# Patient Record
Sex: Female | Born: 1990 | Race: Black or African American | Hispanic: No | Marital: Single | Smoking: Never smoker
Health system: Southern US, Community
[De-identification: ages and names within clinical notes are randomized; demographics above are authoritative.]

## PROBLEM LIST (undated history)

## (undated) DIAGNOSIS — G919 Hydrocephalus, unspecified: Secondary | ICD-10-CM

## (undated) DIAGNOSIS — R519 Headache, unspecified: Secondary | ICD-10-CM

## (undated) DIAGNOSIS — R0600 Dyspnea, unspecified: Secondary | ICD-10-CM

## (undated) HISTORY — PX: VENTRICULOPERITONEAL SHUNT: SHX204

## (undated) HISTORY — DX: Hydrocephalus, unspecified: G91.9

## (undated) HISTORY — PX: LAPAROSCOPIC REVISION VENTRICULAR-PERITONEAL (V-P) SHUNT: SHX5924

---

## 2019-07-27 ENCOUNTER — Other Ambulatory Visit: Payer: Self-pay

## 2019-07-27 ENCOUNTER — Ambulatory Visit (LOCAL_COMMUNITY_HEALTH_CENTER): Payer: Self-pay

## 2019-07-27 DIAGNOSIS — Z113 Encounter for screening for infections with a predominantly sexual mode of transmission: Secondary | ICD-10-CM

## 2019-07-27 LAB — HM HIV SCREENING LAB: HM HIV Screening: NEGATIVE

## 2019-07-27 NOTE — Progress Notes (Signed)
Here for STD screen. Requests PT even though she started 07/25/19.Aileen Fass, RN   RN will call with any positive results.Aileen Fass, RN

## 2019-07-27 NOTE — Progress Notes (Signed)
    STI clinic/screening visit  Subjective:  Noga Fogg is a 28 y.o. female being seen today for an STI screening visit. The patient reports they do not have symptoms.  Patient has the following medical conditions:  Client was treated for syphillis in 2020 @[facility].  There are no active problems to display for this patient.    Chief Complaint  Patient presents with  . SEXUALLY TRANSMITTED DISEASE    Screening    HPI  Patient reports no sympts.  Wants STD screen today.    See flowsheet for further details and programmatic requirements.    The following portions of the patient's history were reviewed and updated as appropriate: allergies, current medications, past medical history, past social history, past surgical history and problem list.  Objective:  There were no vitals filed for this visit.  Physical Exam HENT:     Mouth/Throat:     Pharynx: Oropharynx is clear.  Genitourinary:    General: Normal vulva.     Comments: Mod amt of blood noted   Wet prep not done d/t bleeding Skin:    General: Skin is warm and dry.     Findings: Lesion present. No rash.  Neurological:     Mental Status: She is alert.       Assessment and Plan:  Eli Adami is a 28 y.o. female presenting to the [facility] Department for STI screening  1. Screening examination for venereal disease - HIV [facility] - Syphilis Serology, [facility] - Chlamydia/Gonorrhea [facility] Client will be notified if testing is positive.   No follow-ups on file.  No future appointments.  Hassell Done, FNP

## 2019-08-02 LAB — GONOCOCCUS CULTURE

## 2019-08-08 ENCOUNTER — Encounter: Payer: Self-pay | Admitting: Physician Assistant

## 2019-08-08 DIAGNOSIS — A515 Early syphilis, latent: Secondary | ICD-10-CM | POA: Insufficient documentation

## 2019-09-14 ENCOUNTER — Other Ambulatory Visit: Payer: Self-pay

## 2019-09-14 ENCOUNTER — Encounter: Payer: Self-pay | Admitting: Emergency Medicine

## 2019-09-14 ENCOUNTER — Emergency Department
Admission: EM | Admit: 2019-09-14 | Discharge: 2019-09-14 | Disposition: A | Discharge status: Home / Self Care | Payer: Medicaid Other | Attending: Emergency Medicine | Admitting: Emergency Medicine

## 2019-09-14 ENCOUNTER — Emergency Department: Payer: Medicaid Other

## 2019-09-14 DIAGNOSIS — R1030 Lower abdominal pain, unspecified: Secondary | ICD-10-CM | POA: Diagnosis not present

## 2019-09-14 DIAGNOSIS — O26891 Other specified pregnancy related conditions, first trimester: Secondary | ICD-10-CM | POA: Diagnosis not present

## 2019-09-14 DIAGNOSIS — Z3A01 Less than 8 weeks gestation of pregnancy: Secondary | ICD-10-CM | POA: Insufficient documentation

## 2019-09-14 DIAGNOSIS — R109 Unspecified abdominal pain: Secondary | ICD-10-CM | POA: Diagnosis present

## 2019-09-14 LAB — URINALYSIS, COMPLETE (UACMP) WITH MICROSCOPIC
Bacteria, UA: NONE SEEN
Bilirubin Urine: NEGATIVE
Glucose, UA: NEGATIVE mg/dL
Hgb urine dipstick: NEGATIVE
Ketones, ur: NEGATIVE mg/dL
Leukocytes,Ua: NEGATIVE
Nitrite: NEGATIVE
Protein, ur: NEGATIVE mg/dL
Specific Gravity, Urine: 1.005 (ref 1.005–1.030)
pH: 6 (ref 5.0–8.0)

## 2019-09-14 LAB — COMPREHENSIVE METABOLIC PANEL
ALT: 26 U/L (ref 0–44)
AST: 18 U/L (ref 15–41)
Albumin: 4.3 g/dL (ref 3.5–5.0)
Alkaline Phosphatase: 55 U/L (ref 38–126)
Anion gap: 8 (ref 5–15)
BUN: 8 mg/dL (ref 6–20)
CO2: 24 mmol/L (ref 22–32)
Calcium: 9.3 mg/dL (ref 8.9–10.3)
Chloride: 104 mmol/L (ref 98–111)
Creatinine, Ser: 0.79 mg/dL (ref 0.44–1.00)
GFR calc Af Amer: >60 mL/min (ref >60)
GFR calc non Af Amer: >60 mL/min (ref >60)
Glucose, Bld: 99 mg/dL (ref 70–99)
Potassium: 3.9 mmol/L (ref 3.5–5.1)
Sodium: 136 mmol/L (ref 135–145)
Total Bilirubin: 0.6 mg/dL (ref 0.3–1.2)
Total Protein: 7.6 g/dL (ref 6.5–8.1)

## 2019-09-14 LAB — WET PREP, GENITAL
Sperm: NONE SEEN
Trich, Wet Prep: NONE SEEN
Yeast Wet Prep HPF POC: NONE SEEN

## 2019-09-14 LAB — HCG, QUANTITATIVE, PREGNANCY: hCG, Beta Chain, Quant, S: 60830 m[IU]/mL — ABNORMAL HIGH (ref <5)

## 2019-09-14 LAB — LIPASE, BLOOD: Lipase: 41 U/L (ref 11–51)

## 2019-09-14 LAB — CBC
HCT: 38.2 % (ref 36.0–46.0)
Hemoglobin: 12.4 g/dL (ref 12.0–15.0)
MCH: 26.2 pg (ref 26.0–34.0)
MCHC: 32.5 g/dL (ref 30.0–36.0)
MCV: 80.8 fL (ref 80.0–100.0)
Platelets: 235 10*3/uL (ref 150–400)
RBC: 4.73 MIL/uL (ref 3.87–5.11)
RDW: 13.2 % (ref 11.5–15.5)
WBC: 8.3 10*3/uL (ref 4.0–10.5)
nRBC: 0 % (ref 0.0–0.2)

## 2019-09-14 LAB — POCT PREGNANCY, URINE: Preg Test, Ur: POSITIVE — AB

## 2019-09-14 MED ORDER — SODIUM CHLORIDE 0.9% FLUSH: 3.0000 mL | Freq: Once | INTRAVENOUS | Status: DC

## 2019-09-14 NOTE — ED Provider Notes (Signed)
[facility] Emergency Department Provider Note   ____________________________________________   First MD Initiated Contact with Patient 09/14/19 1811     (approximate)  I have reviewed the triage vital signs and the nursing notes.   HISTORY  Chief Complaint Abdominal Pain    HPI Mary Brewer is a 28 y.o. female with past medical history of hydrocephalus status post VP shunt presents to the ED complaining of abdominal pain.  Patient reports crampy bilateral lower quadrant abdominal pain for approximately the past 2 weeks.  Pain is constant and not exacerbated or alleviated by anything.  Patient states she has been trying to get pregnant for a long time and recently had 3+ pregnancy test at home.  She denies any vaginal bleeding, vaginal discharge, dysuria, or hematuria.  She states her last menstrual period was approximately 2 months ago.  She has not yet seen an OB and this is her first pregnancy.        Past Medical History:  Diagnosis Date  . Hydrocephalus (HCC)    birth and VP shunt [facility]    Patient Active Problem List   Diagnosis Date Noted  . 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 No pertinent surgical history.  Prior to Admission medications   Not on File    Allergies Patient has no known allergies.  No family history on file.  Social History Social History   Tobacco Use  . Smoking status: Never Smoker  . Smokeless tobacco: Never Used  Substance Use Topics  . Alcohol use: Not on file  . Drug use: Not on file    Review of Systems  Constitutional: No fever/chills Eyes: No visual changes. ENT: No sore throat. Cardiovascular: Denies chest pain. Respiratory: Denies shortness of breath. Gastrointestinal: Positive for abdominal pain.  No nausea, no vomiting.  No diarrhea.  No constipation. Genitourinary: Negative for dysuria. Musculoskeletal: Negative for back pain. Skin: Negative for rash.  Neurological: Negative for headaches, focal weakness or numbness.  ____________________________________________   PHYSICAL EXAM:  VITAL SIGNS: ED Triage Vitals  Enc Vitals Group     BP 09/14/19 1651 (!) 141/94     Pulse Rate 09/14/19 1651 78     Resp 09/14/19 1651 16     Temp 09/14/19 1651 98.5 F (36.9 C)     Temp Source 09/14/19 1651 Oral     SpO2 09/14/19 1651 100 %     Weight 09/14/19 1652 175 lb (79.4 kg)     Height 09/14/19 1652 5\' 5"  (1.651 m)     Head Circumference --      Peak Flow --      Pain Score 09/14/19 1651 5     Pain Loc --      Pain Edu? --      Excl. in [place]? --     Constitutional: Alert and oriented. Eyes: Conjunctivae are normal. Head: Atraumatic. Nose: No congestion/rhinnorhea. Mouth/Throat: Mucous membranes are moist. Neck: Normal ROM Cardiovascular: Normal rate, regular rhythm. Grossly normal heart sounds. Respiratory: Normal respiratory effort.  No retractions. Lungs CTAB. Gastrointestinal: Soft and nontender. No distention. Genitourinary: Cervical os closed, no cervical motion or next tenderness.  No bleeding or discharge noted. Musculoskeletal: No lower extremity tenderness nor edema. Neurologic:  Normal speech and language. No gross focal neurologic deficits are appreciated. Skin:  Skin is warm, dry and intact. No rash noted. Psychiatric: Mood and affect are normal. Speech and behavior are normal.  ____________________________________________   LABS (all labs ordered  are listed, but only abnormal results are displayed)  Labs Reviewed  WET PREP, GENITAL - Abnormal; Notable for the following components:      Result Value   Clue Cells Wet Prep HPF POC PRESENT (*)    WBC, Wet Prep HPF POC RARE (*)    All other components within normal limits  URINALYSIS, COMPLETE (UACMP) WITH MICROSCOPIC - Abnormal; Notable for the following components:   Color, Urine STRAW (*)    APPearance CLEAR (*)    All other components within normal limits  HCG,  QUANTITATIVE, PREGNANCY - Abnormal; Notable for the following components:   hCG, Beta Chain, Quant, S 60,830 (*)    All other components within normal limits  POCT PREGNANCY, URINE - Abnormal; Notable for the following components:   Preg Test, Ur POSITIVE (*)    All other components within normal limits  GC/CHLAMYDIA PROBE AMP  LIPASE, BLOOD  COMPREHENSIVE METABOLIC PANEL  CBC  POC URINE PREG, ED     PROCEDURES  Procedure(s) performed (including Critical Care):  Procedures   ____________________________________________   INITIAL IMPRESSION / ASSESSMENT AND PLAN / ED COURSE       28 year old female presents to the ED for bilateral lower quadrant crampy abdominal pain for approximately the past 2 weeks, recently had positive pregnancy test at home.  Abdominal exam is soft and nontender, doubt surgical abdominal pathology.  Pelvic exam is unremarkable.  UA without evidence of infection.  No bleeding to necessitate RhoGam.  Will obtain ultrasound to ensure pregnancy is intrauterine, labs otherwise unremarkable.  Ultrasound shows intrauterine pregnancy with appropriate fetal heart tones.  Sizing of fetus does appear to be small given dates provided by patient.  Unclear if this is due to inaccurate dating or poor fetal growth, will have patient follow-up with OB/GYN.  Counseled patient to return to the ED for new or worsening symptoms, patient agrees with plan.      ____________________________________________   FINAL CLINICAL IMPRESSION(S) / ED DIAGNOSES  Final diagnoses:  Abdominal pain during pregnancy in first trimester     ED Discharge Orders    None       Note:  This document was prepared using Dragon voice recognition software and may include unintentional dictation errors.   Blake Divine, MD 09/14/19 2110

## 2019-09-14 NOTE — ED Triage Notes (Signed)
Has taken 3 home pregnancy tests, all positive.  Arrives today c/o abdominal and back x 2 weeks.  LMP:  12/29/2018.

## 2019-09-14 NOTE — ED Notes (Signed)
Pt up to give a dirty urine sample for gonorrhea and chlamydia. Explained patient will go to [place]. Pt's SO remains at bedside.

## 2019-09-14 NOTE — ED Notes (Signed)
Pt c/o abdominal pain x several weeks. Pt states has had 3 positive home pregnancy tests with lower abdominal pain. Pt is alert and oriented, NAD noted, respirations even and unlabored, skin warm, dry, and intact.

## 2019-09-14 NOTE — ED Notes (Signed)
Patient transported to Ultrasound 

## 2019-09-19 ENCOUNTER — Other Ambulatory Visit: Payer: Self-pay

## 2019-09-19 LAB — GC/CHLAMYDIA PROBE AMP
Chlamydia trachomatis, NAA: NEGATIVE
Neisseria Gonorrhoeae by PCR: NEGATIVE

## 2019-11-07 NOTE — H&P (Signed)
Mary Brewer is a 28 y.o. female here for exlap and right ovarian cystectomy  .  U/s performed in ED at [facility]  09/14/2019. Pt has had pelvic pain daily for 3 weeks . + preg at 14+3 weeks   Syphilis  06/2019. New partner  U/s today   Uterus anteverted  Single, viable IUP, S=[redacted]w[redacted]d  FHR=156bpm  Cervical length=4.04cm  Small amt of free fluid seen in PCDS  Rt complex adnexal mass=10.59 x 7.67 x 10.61cm; fluid pocket seen superior to mass=7.45 x 1.87 x 2.90cm  Lt complex septated ovarian cyst=2.7cm; septation=0.21cm    Past Medical History:  has a past medical history of Encounter for supervision of normal pregnancy in second trimester (11/03/2019) and Hydrocephalus (CMS-HCC).  Past Surgical History:  has a past surgical history that includes Externalization Vp Shunt. Family History: family history includes Diabetes in Mary Brewer; Neuropathy in Mary Brewer. Social History:  reports that she has never smoked. She has never used smokeless tobacco. She reports current alcohol use. She reports current drug use. Drug: Marijuana. OB/GYN History:          OB History    Gravida  1   Para      Term      Preterm      AB      Living        SAB      TAB      Ectopic      Molar      Multiple      Live Births             Allergies: has No Known Allergies. Medications:  Current Outpatient Medications:  .  prenatal vitamin with iron-folic acid (PRENATAL TABLETS) tablet, Take 1 tablet by mouth once daily, Disp: , Rfl:  .  valACYclovir (VALTREX) 1000 MG tablet, Take 1 tablet (1,000 mg total) by mouth 2 (two) times daily for 10 days SuppressiveTherapy, Disp: 20 tablet, Rfl: 0  Review of Systems: General:                      No fatigue or weight loss Eyes:                           No vision changes Ears:                            No hearing difficulty Respiratory:                No cough or shortness of breath Pulmonary:                  No asthma or  shortness of breath Cardiovascular:           No chest pain, palpitations, dyspnea on exertion Gastrointestinal:          No abdominal bloating, chronic diarrhea, constipations, masses, pain or hematochezia Genitourinary:             No hematuria, dysuria, abnormal vaginal discharge, +pelvic pain, Menometrorrhagia Lymphatic:                   No swollen lymph nodes Musculoskeletal:         No muscle weakness Neurologic:                  No extremity weakness, syncope, seizure disorder Psychiatric:  No history of depression, delusions or suicidal/homicidal ideation    Exam:      Vitals:   11/03/19 1504  BP: 130/87  Pulse: 84    Body mass index is 36.78 kg/m.  WDWN / black female in NAD   Lungs: CTA  CV : RRR without murmur   Breast: exam done in sitting and lying position : No dimpling or retraction, no dominant mass, no spontaneous discharge, no axillary adenopathy Neck:  no thyromegaly Abdomen: soft , no mass, normal active bowel sounds,  non-tender, no rebound tenderness  + FHM noted with bedside u/s  Pelvic: tanner stage 5 ,  External genitalia: vulva /labiashallow ulceration left perirectal . ++TTP Urethra: no prolapse Vagina: normal physiologic d/c Cervix: no lesions, no cervical motion tenderness   Uterus: normal size shape and contour, non-tender Adnexa: right adnexal fullness  Rectovaginal:   Impression:   The primary encounter diagnosis was Vulvar lesion. Diagnoses of Complex cyst of right ovary and Intrauterine pregnancy were also pertinent to this visit.  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  I recommend a exlap and right ovarian cystectomy and possible right oophorectomy .   Benefits  and risks to surgery: The proposed benefit of the surgery has been discussed with the patient. The possible risks include, but are not limited to: organ injury to the bowel , bladder, ureters, and major blood vessels and nerves. There is a possibility of additional surgeries resulting from these injuries. There is also the risk of blood transfusion and the need to receive blood products during or after the procedure which may rarely lead to HIV or Hepatitis C infection. There is a risk of developing a deep venous thrombosis or a pulmonary embolism . There is the possibility of wound infection and also anesthetic complications, even the rare possibility of death. The patient understands these risks and wishes to proceed. All questions have been answered  Rarely risk of causing fetal loss.   Pt will return for preop signing of consents 60 minutes spent with counseling and direct pt care

## 2019-11-08 ENCOUNTER — Other Ambulatory Visit
Admission: RE | Admit: 2019-11-08 | Discharge: 2019-11-08 | Disposition: A | Discharge status: Home / Self Care | Payer: Medicaid Other | Source: Ambulatory Visit | Attending: Obstetrics and Gynecology | Admitting: Obstetrics and Gynecology

## 2019-11-08 DIAGNOSIS — Z20828 Contact with and (suspected) exposure to other viral communicable diseases: Secondary | ICD-10-CM | POA: Diagnosis not present

## 2019-11-08 DIAGNOSIS — Z01812 Encounter for preprocedural laboratory examination: Secondary | ICD-10-CM | POA: Insufficient documentation

## 2019-11-08 HISTORY — DX: Headache, unspecified: R51.9

## 2019-11-08 HISTORY — DX: Dyspnea, unspecified: R06.00

## 2019-11-09 LAB — SARS CORONAVIRUS 2 (TAT 6-24 HRS): SARS Coronavirus 2: NEGATIVE

## 2019-11-10 ENCOUNTER — Encounter
Admission: RE | Admit: 2019-11-10 | Discharge: 2019-11-10 | Disposition: A | Discharge status: Home / Self Care | Payer: Medicaid Other | Source: Ambulatory Visit | Attending: Obstetrics and Gynecology | Admitting: Obstetrics and Gynecology

## 2019-11-10 NOTE — Progress Notes (Signed)
preadmit had concerns about what the patient told them yesterday . She was feeling tachycardic and feeling like she was going to pass out .  She has had a VP shunt for years and this has not been an issue with her health . Is speaking with her today she denies symptoms and states that these symptoms happen rarely . She is [redacted] weeks pregnant . She is eating and drinking normally ibuprofen spoke Dr Amie Critchley and he is ok with proceeding with her case given it is a time sensitive surgery and her symptoms are infrequent. I believe the benefit of surgery out weigh the risk of her symptoms for this 1 hr  GETA case .

## 2019-11-10 NOTE — Pre-Procedure Instructions (Signed)
During phone interview pt c/o tachycardia and feeling like she was going to pass out. Pt with hydrocephalus who has a VP shunt. Pt is [redacted] weeks pregnant. Informed Dr Rosey Bath of this and he wants medical clearance due to feeling like she is going to pass out. Pt does not have a PCP and is only seen at the health department

## 2019-11-10 NOTE — Patient Instructions (Signed)
Your procedure is scheduled on: 11-11-19 FRIDAY Report to Same Day Surgery 2nd floor medical mall [facility] Entrance-take elevator on left to 2nd floor.  Check in with surgery information desk.) To find out your arrival time please call [phone] between 1PM - 3PM on 11-10-19 THURSDAY  Remember: Instructions that are not followed completely may result in serious medical risk, up to and including death, or upon the discretion of your surgeon and anesthesiologist your surgery may need to be rescheduled.    _x___ 1. Do not eat food after midnight the night before your procedure. NO GUM OR CANDY AFTER MIDNIGHT. You may drink clear liquids up to 2 hours before you are scheduled to arrive at the hospital for your procedure.  Do not drink clear liquids within 2 hours of your scheduled arrival to the hospital.  Clear liquids include  --Water or Apple juice without pulp  --Clear carbohydrate beverage such as ClearFast or Gatorade  --Black Coffee or Clear Tea (No milk, no creamers, do not add anything to the coffee or Tea   ____Ensure clear carbohydrate drink on the way to the hospital for bariatric patients  ____Ensure clear carbohydrate drink 3 hours before surgery.    __x__ 2. No Alcohol for 24 hours before or after surgery.   __x__3. No Smoking or e-cigarettes for 24 prior to surgery.  Do not use any chewable tobacco products for at least 6 hour prior to surgery   ____  4. Bring all medications with you on the day of surgery if instructed.    __x__ 5. Notify your doctor if there is any change in your medical condition     (cold, fever, infections).    x___6. On the morning of surgery brush your teeth with toothpaste and water.  You may rinse your mouth with mouth wash if you wish.  Do not swallow any toothpaste or mouthwash.   Do not wear jewelry, make-up, hairpins, clips or nail polish.  Do not wear lotions, powders, or perfumes. You may wear deodorant.  Do not shave 48 hours  prior to surgery. Men may shave face and neck.  Do not bring valuables to the hospital.    [facility] is not responsible for any belongings or valuables.               Contacts, dentures or bridgework may not be worn into surgery.  Leave your suitcase in the car. After surgery it may be brought to your room.  For patients admitted to the hospital, discharge time is determined by your treatment team.  _  Patients discharged the day of surgery will not be allowed to drive home.  You will need someone to drive you home and stay with you the night of your procedure.    Please read over the following fact sheets that you were given:   [facility] Preparing for Surgery   ____ Take anti-hypertensive listed below, cardiac, seizure, asthma,anti-reflux and psychiatric medicines. These include:  1. NONE  2.  3.  4.  5.  6.  ____Fleets enema or Magnesium Citrate as directed.   ____ Use CHG Soap or sage wipes as directed on instruction sheet   ____ Use inhalers on the day of surgery and bring to hospital day of surgery  ____ Stop Metformin and Janumet 2 days prior to surgery.    ____ Take 1/2 of usual insulin dose the night before surgery and none on the morning surgery.   ____ Follow recommendations from  Cardiologist, Pulmonologist or PCP regarding stopping Aspirin, Coumadin, Plavix ,Eliquis, Effient, or Pradaxa, and Pletal.  X____Stop Anti-inflammatories such as Advil, Aleve, Ibuprofen, Motrin, Naproxen, Naprosyn, Goodies powders or aspirin products NOW-OK to take Tylenol   ____ Stop supplements until after surgery.     ____ Bring C-Pap to the hospital.

## 2019-11-11 ENCOUNTER — Observation Stay: Payer: Medicaid Other | Admitting: Anesthesiology

## 2019-11-11 ENCOUNTER — Encounter: Payer: Self-pay | Admitting: *Deleted

## 2019-11-11 ENCOUNTER — Other Ambulatory Visit: Payer: Self-pay

## 2019-11-11 ENCOUNTER — Observation Stay
Admission: RE | Admit: 2019-11-11 | Discharge: 2019-11-11 | Disposition: A | Discharge status: Home / Self Care | Payer: Medicaid Other | Attending: Obstetrics and Gynecology | Admitting: Obstetrics and Gynecology

## 2019-11-11 ENCOUNTER — Encounter
Admission: RE | Disposition: A | Discharge status: Home / Self Care | Payer: Self-pay | Source: Home / Self Care | Attending: Obstetrics and Gynecology

## 2019-11-11 DIAGNOSIS — Z9889 Other specified postprocedural states: Secondary | ICD-10-CM

## 2019-11-11 DIAGNOSIS — D270 Benign neoplasm of right ovary: Secondary | ICD-10-CM | POA: Insufficient documentation

## 2019-11-11 DIAGNOSIS — O99891 Other specified diseases and conditions complicating pregnancy: Secondary | ICD-10-CM | POA: Diagnosis not present

## 2019-11-11 DIAGNOSIS — Z3A15 15 weeks gestation of pregnancy: Secondary | ICD-10-CM | POA: Insufficient documentation

## 2019-11-11 HISTORY — PX: OVARIAN CYST REMOVAL: SHX89

## 2019-11-11 HISTORY — PX: LAPAROTOMY: SHX154

## 2019-11-11 LAB — BASIC METABOLIC PANEL
Anion gap: 9 (ref 5–15)
BUN: 7 mg/dL (ref 6–20)
CO2: 22 mmol/L (ref 22–32)
Calcium: 9.2 mg/dL (ref 8.9–10.3)
Chloride: 107 mmol/L (ref 98–111)
Creatinine, Ser: 0.54 mg/dL (ref 0.44–1.00)
GFR calc Af Amer: >60 mL/min (ref >60)
GFR calc non Af Amer: >60 mL/min (ref >60)
Glucose, Bld: 97 mg/dL (ref 70–99)
Potassium: 3.6 mmol/L (ref 3.5–5.1)
Sodium: 138 mmol/L (ref 135–145)

## 2019-11-11 LAB — URINE DRUG SCREEN, QUALITATIVE (ARMC ONLY)
Amphetamines, Ur Screen: NOT DETECTED
Barbiturates, Ur Screen: NOT DETECTED
Benzodiazepine, Ur Scrn: NOT DETECTED
Cannabinoid 50 Ng, Ur ~~LOC~~: NOT DETECTED
Cocaine Metabolite,Ur ~~LOC~~: NOT DETECTED
MDMA (Ecstasy)Ur Screen: NOT DETECTED
Methadone Scn, Ur: NOT DETECTED
Opiate, Ur Screen: NOT DETECTED
Phencyclidine (PCP) Ur S: NOT DETECTED
Tricyclic, Ur Screen: NOT DETECTED

## 2019-11-11 LAB — CBC
HCT: 36.3 % (ref 36.0–46.0)
Hemoglobin: 12.4 g/dL (ref 12.0–15.0)
MCH: 26.6 pg (ref 26.0–34.0)
MCHC: 34.2 g/dL (ref 30.0–36.0)
MCV: 77.9 fL — ABNORMAL LOW (ref 80.0–100.0)
Platelets: 199 10*3/uL (ref 150–400)
RBC: 4.66 MIL/uL (ref 3.87–5.11)
RDW: 13.2 % (ref 11.5–15.5)
WBC: 8.2 10*3/uL (ref 4.0–10.5)
nRBC: 0 % (ref 0.0–0.2)

## 2019-11-11 LAB — ABO/RH: ABO/RH(D): A POS

## 2019-11-11 LAB — TYPE AND SCREEN
ABO/RH(D): A POS
Antibody Screen: NEGATIVE

## 2019-11-11 SURGERY — LAPAROTOMY, EXPLORATORY
Anesthesia: General | Laterality: Right

## 2019-11-11 MED ORDER — PROPOFOL 10 MG/ML IV BOLUS
INTRAVENOUS | Status: DC | PRN
  Administered 2019-11-11: 200 mg via INTRAVENOUS

## 2019-11-11 MED ORDER — SUCCINYLCHOLINE CHLORIDE 20 MG/ML IJ SOLN
INTRAMUSCULAR | Status: DC | PRN
  Administered 2019-11-11: 100 mg via INTRAVENOUS

## 2019-11-11 MED ORDER — ROCURONIUM BROMIDE 100 MG/10ML IV SOLN
INTRAVENOUS | Status: DC | PRN
  Administered 2019-11-11: 10 mg via INTRAVENOUS
  Administered 2019-11-11: 30 mg via INTRAVENOUS
  Administered 2019-11-11: 10 mg via INTRAVENOUS

## 2019-11-11 MED ORDER — ONDANSETRON HCL 4 MG/2ML IJ SOLN: 4.0000 mg | Freq: Once | INTRAMUSCULAR | Status: DC | PRN

## 2019-11-11 MED ORDER — OXYCODONE HCL 5 MG/5ML PO SOLN: 5.0000 mg | Freq: Once | ORAL | Status: AC | PRN

## 2019-11-11 MED ORDER — DEXAMETHASONE SODIUM PHOSPHATE 10 MG/ML IJ SOLN
INTRAMUSCULAR | Status: DC | PRN
  Administered 2019-11-11: 10 mg via INTRAVENOUS

## 2019-11-11 MED ORDER — FENTANYL CITRATE (PF) 100 MCG/2ML IJ SOLN
INTRAMUSCULAR | Status: AC
  Administered 2019-11-11: 50 ug via INTRAVENOUS
  Filled 2019-11-11: qty 2

## 2019-11-11 MED ORDER — MIDAZOLAM HCL 2 MG/2ML IJ SOLN
INTRAMUSCULAR | Status: AC
  Filled 2019-11-11: qty 2

## 2019-11-11 MED ORDER — FENTANYL CITRATE (PF) 100 MCG/2ML IJ SOLN
INTRAMUSCULAR | Status: AC
  Filled 2019-11-11: qty 2

## 2019-11-11 MED ORDER — NEOSTIGMINE METHYLSULFATE 10 MG/10ML IV SOLN
INTRAVENOUS | Status: DC | PRN
  Administered 2019-11-11: 5 mg via INTRAVENOUS

## 2019-11-11 MED ORDER — ONDANSETRON 4 MG PO TBDP: 4.0000 mg | ORAL_TABLET | Freq: Four times a day (QID) | ORAL | 0 refills | Status: AC | PRN

## 2019-11-11 MED ORDER — OXYCODONE-ACETAMINOPHEN 5-325 MG PO TABS: 2.0000 | ORAL_TABLET | ORAL | Status: DC | PRN

## 2019-11-11 MED ORDER — FENTANYL CITRATE (PF) 100 MCG/2ML IJ SOLN
INTRAMUSCULAR | Status: AC
  Administered 2019-11-11: 10:00:00 50 ug via INTRAVENOUS
  Filled 2019-11-11: qty 2

## 2019-11-11 MED ORDER — SOD CITRATE-CITRIC ACID 500-334 MG/5ML PO SOLN
30.0000 mL | ORAL | Status: AC
  Administered 2019-11-11: 30 mL via ORAL
  Filled 2019-11-11: qty 30

## 2019-11-11 MED ORDER — ACETAMINOPHEN 500 MG PO TABS
1000.0000 mg | ORAL_TABLET | ORAL | Status: AC
  Administered 2019-11-11: 07:00:00 1000 mg via ORAL

## 2019-11-11 MED ORDER — LIDOCAINE HCL (CARDIAC) PF 100 MG/5ML IV SOSY
PREFILLED_SYRINGE | INTRAVENOUS | Status: DC | PRN
  Administered 2019-11-11: 80 mg via INTRAVENOUS

## 2019-11-11 MED ORDER — FENTANYL CITRATE (PF) 100 MCG/2ML IJ SOLN
INTRAMUSCULAR | Status: DC | PRN
  Administered 2019-11-11: 25 ug via INTRAVENOUS
  Administered 2019-11-11: 50 ug via INTRAVENOUS
  Administered 2019-11-11: 25 ug via INTRAVENOUS

## 2019-11-11 MED ORDER — FAMOTIDINE 20 MG PO TABS
20.0000 mg | ORAL_TABLET | Freq: Once | ORAL | Status: AC
  Administered 2019-11-11: 07:00:00 20 mg via ORAL

## 2019-11-11 MED ORDER — FENTANYL CITRATE (PF) 100 MCG/2ML IJ SOLN
50.0000 ug | INTRAMUSCULAR | Status: AC | PRN
  Administered 2019-11-11 (×2): 50 ug via INTRAVENOUS

## 2019-11-11 MED ORDER — OXYCODONE HCL 5 MG PO TABS
5.0000 mg | ORAL_TABLET | Freq: Once | ORAL | Status: AC | PRN
  Administered 2019-11-11: 11:00:00 5 mg via ORAL

## 2019-11-11 MED ORDER — FENTANYL CITRATE (PF) 100 MCG/2ML IJ SOLN
25.0000 ug | INTRAMUSCULAR | Status: DC | PRN
  Administered 2019-11-11 (×3): 50 ug via INTRAVENOUS

## 2019-11-11 MED ORDER — LACTATED RINGERS IV SOLN: INTRAVENOUS | Status: DC

## 2019-11-11 MED ORDER — GABAPENTIN 300 MG PO CAPS
ORAL_CAPSULE | ORAL | Status: AC
  Administered 2019-11-11: 07:00:00 300 mg via ORAL
  Filled 2019-11-11: qty 1

## 2019-11-11 MED ORDER — FENTANYL CITRATE (PF) 100 MCG/2ML IJ SOLN
50.0000 ug | INTRAMUSCULAR | Status: DC | PRN
  Administered 2019-11-11: 50 ug via INTRAVENOUS

## 2019-11-11 MED ORDER — BUPIVACAINE LIPOSOME 1.3 % IJ SUSP
INTRAMUSCULAR | Status: AC
  Filled 2019-11-11: qty 20

## 2019-11-11 MED ORDER — HEMOSTATIC AGENTS (NO CHARGE) OPTIME
TOPICAL | Status: DC | PRN
  Administered 2019-11-11: 1 via TOPICAL

## 2019-11-11 MED ORDER — MIDAZOLAM HCL 2 MG/2ML IJ SOLN
INTRAMUSCULAR | Status: DC | PRN
  Administered 2019-11-11: 1 mg via INTRAVENOUS

## 2019-11-11 MED ORDER — GABAPENTIN 300 MG PO CAPS
300.0000 mg | ORAL_CAPSULE | ORAL | Status: AC
  Administered 2019-11-11: 07:00:00 300 mg via ORAL

## 2019-11-11 MED ORDER — BUPIVACAINE LIPOSOME 1.3 % IJ SUSP: 20.0000 mL | Freq: Once | INTRAMUSCULAR | Status: DC

## 2019-11-11 MED ORDER — SODIUM CHLORIDE 0.9 % IV SOLN
INTRAVENOUS | Status: DC | PRN
  Administered 2019-11-11: 70 mL

## 2019-11-11 MED ORDER — ONDANSETRON 4 MG PO TBDP: 4.0000 mg | ORAL_TABLET | Freq: Four times a day (QID) | ORAL | Status: DC | PRN

## 2019-11-11 MED ORDER — CEFAZOLIN SODIUM-DEXTROSE 2-4 GM/100ML-% IV SOLN
INTRAVENOUS | Status: AC
  Administered 2019-11-11: 2000 mg
  Filled 2019-11-11: qty 100

## 2019-11-11 MED ORDER — OXYCODONE-ACETAMINOPHEN 5-325 MG PO TABS: ORAL_TABLET | ORAL | 0 refills | Status: AC

## 2019-11-11 MED ORDER — OXYCODONE HCL 5 MG PO TABS
ORAL_TABLET | ORAL | Status: AC
  Administered 2019-11-11: 5 mg via ORAL
  Filled 2019-11-11: qty 1

## 2019-11-11 MED ORDER — ONDANSETRON HCL 4 MG/2ML IJ SOLN
INTRAMUSCULAR | Status: DC | PRN
  Administered 2019-11-11: 4 mg via INTRAVENOUS

## 2019-11-11 MED ORDER — LACTATED RINGERS IV SOLN
INTRAVENOUS | Status: DC
  Administered 2019-11-11 (×2): via INTRAVENOUS

## 2019-11-11 MED ORDER — BUPIVACAINE HCL 0.5 % IJ SOLN
INTRAMUSCULAR | Status: DC | PRN
  Administered 2019-11-11: 30 mL

## 2019-11-11 MED ORDER — MEPERIDINE HCL 50 MG/ML IJ SOLN: 6.2500 mg | INTRAMUSCULAR | Status: DC | PRN

## 2019-11-11 MED ORDER — ACETAMINOPHEN 500 MG PO TABS
ORAL_TABLET | ORAL | Status: AC
  Administered 2019-11-11: 1000 mg via ORAL
  Filled 2019-11-11: qty 2

## 2019-11-11 MED ORDER — SEVOFLURANE IN SOLN
RESPIRATORY_TRACT | Status: AC
  Filled 2019-11-11: qty 250

## 2019-11-11 MED ORDER — SODIUM CHLORIDE (PF) 0.9 % IJ SOLN
INTRAMUSCULAR | Status: AC
  Filled 2019-11-11: qty 50

## 2019-11-11 MED ORDER — FAMOTIDINE 20 MG PO TABS
ORAL_TABLET | ORAL | Status: AC
  Administered 2019-11-11: 20 mg via ORAL
  Filled 2019-11-11: qty 1

## 2019-11-11 MED ORDER — PROPOFOL 10 MG/ML IV BOLUS
INTRAVENOUS | Status: AC
  Filled 2019-11-11: qty 20

## 2019-11-11 MED ORDER — ATROPINE SULFATE 0.4 MG/ML IJ SOLN
INTRAMUSCULAR | Status: DC | PRN
  Administered 2019-11-11: 1 mg via INTRAVENOUS

## 2019-11-11 SURGICAL SUPPLY — 48 items
BAG URINE DRAIN 2000ML AR STRL (UROLOGICAL SUPPLIES) ×4 IMPLANT
BARRIER ADHS 3X4 INTERCEED (GAUZE/BANDAGES/DRESSINGS) ×8 IMPLANT
BLADE SURG SZ11 CARB STEEL (BLADE) IMPLANT
CANISTER SUCT 1200ML W/VALVE (MISCELLANEOUS) ×4 IMPLANT
CATH ROBINSON RED A/P 16FR (CATHETERS) ×4 IMPLANT
CHLORAPREP W/TINT 26 (MISCELLANEOUS) ×4 IMPLANT
CLOSURE WOUND 1/4X4 (GAUZE/BANDAGES/DRESSINGS) ×1
COVER LIGHT HANDLE STERIS (MISCELLANEOUS) ×4 IMPLANT
COVER WAND RF STERILE (DRAPES) IMPLANT
DRSG TEGADERM 2-3/8X2-3/4 SM (GAUZE/BANDAGES/DRESSINGS) ×4 IMPLANT
GLOVE BIO SURGEON STRL SZ8 (GLOVE) ×16 IMPLANT
GOWN STRL REUS W/ TWL LRG LVL3 (GOWN DISPOSABLE) ×4 IMPLANT
GOWN STRL REUS W/ TWL XL LVL3 (GOWN DISPOSABLE) ×2 IMPLANT
GOWN STRL REUS W/TWL LRG LVL3 (GOWN DISPOSABLE) ×4
GOWN STRL REUS W/TWL XL LVL3 (GOWN DISPOSABLE) ×2
GRASPER SUT TROCAR 14GX15 (MISCELLANEOUS) IMPLANT
HEMOSTAT ARISTA ABSORB 3G PWDR (HEMOSTASIS) ×4 IMPLANT
IRRIGATION STRYKERFLOW (MISCELLANEOUS) IMPLANT
IRRIGATOR STRYKERFLOW (MISCELLANEOUS)
IV NS 1000ML (IV SOLUTION)
IV NS 1000ML BAXH (IV SOLUTION) IMPLANT
KIT TURNOVER CYSTO (KITS) ×4 IMPLANT
LABEL OR SOLS (LABEL) ×4 IMPLANT
NS IRRIG 500ML POUR BTL (IV SOLUTION) ×4 IMPLANT
PACK GYN LAPAROSCOPIC (MISCELLANEOUS) ×4 IMPLANT
PAD OB MATERNITY 4.3X12.25 (PERSONAL CARE ITEMS) ×4 IMPLANT
PAD PREP 24X41 OB/GYN DISP (PERSONAL CARE ITEMS) ×4 IMPLANT
PENCIL SMOKE EVACUATOR COATED (MISCELLANEOUS) ×4 IMPLANT
POUCH SPECIMEN RETRIEVAL 10MM (ENDOMECHANICALS) IMPLANT
SET TUBE SMOKE EVAC HIGH FLOW (TUBING) ×4 IMPLANT
SHEARS HARMONIC ACE PLUS 36CM (ENDOMECHANICALS) IMPLANT
SLEEVE ENDOPATH XCEL 5M (ENDOMECHANICALS) IMPLANT
SPONGE GAUZE 2X2 8PLY STER LF (GAUZE/BANDAGES/DRESSINGS) ×1
SPONGE GAUZE 2X2 8PLY STRL LF (GAUZE/BANDAGES/DRESSINGS) ×3 IMPLANT
SPONGE KITTNER 5P (MISCELLANEOUS) ×4 IMPLANT
STAPLER INSORB 30 2030 C-SECTI (MISCELLANEOUS) ×4 IMPLANT
STRIP CLOSURE SKIN 1/4X4 (GAUZE/BANDAGES/DRESSINGS) ×3 IMPLANT
SUT CHROMIC 2 0 CT 1 (SUTURE) ×4 IMPLANT
SUT VIC AB 0 CT1 27 (SUTURE) ×4
SUT VIC AB 0 CT1 27XCR 8 STRN (SUTURE) ×4 IMPLANT
SUT VIC AB 0 CT1 36 (SUTURE) ×8 IMPLANT
SUT VIC AB 2-0 UR6 27 (SUTURE) IMPLANT
SUT VIC AB 4-0 SH 27 (SUTURE) ×6
SUT VIC AB 4-0 SH 27XANBCTRL (SUTURE) ×6 IMPLANT
SWABSTK COMLB BENZOIN TINCTURE (MISCELLANEOUS) ×4 IMPLANT
SYR 20ML LL LF (SYRINGE) ×4 IMPLANT
TROCAR ENDO BLADELESS 11MM (ENDOMECHANICALS) IMPLANT
TROCAR XCEL NON-BLD 5MMX100MML (ENDOMECHANICALS) IMPLANT

## 2019-11-11 NOTE — Op Note (Signed)
Mary Brewer, [place] MEDICAL RECORD [id number] ACCOUNT [id number] DATE OF BIRTH:1991/07/15 FACILITY: [facility] LOCATION: [facility] PHYSICIAN:Itxel Wickard Josefine Class, MD  OPERATIVE REPORT  DATE OF PROCEDURE:  11/11/2019  PREOPERATIVE DIAGNOSES:   1.  At 15 weeks estimated gestational age. 2.  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  POSTOPERATIVE DIAGNOSES:   1.  At 15 weeks estimated gestational age. 2.  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  PROCEDURE:  Exploratory laparotomy, right ovarian cystectomy with removal of mature teratoma.  ANESTHESIA:  General endotracheal anesthesia.  SURGEON:  Laverta Baltimore, MD.  FIRST ASSISTANT:  Chelsea Ward, MD  INDICATIONS:  A 28 year old gravida 1, para 0 patient at 67 weeks estimated gestational age with 6-week history of right-sided pelvic pain.  The patient underwent an ultrasound that showed an 11 x 10 cm complex ovarian cyst consistent with dermoid.  DESCRIPTION OF PROCEDURE:  After adequate general endotracheal anesthesia, the patient was placed in dorsal supine position.  The patient's legs were placed in the [place].  The patient's abdomen, perineum and vagina were prepped and draped in  normal sterile fashion.  Timeout was performed.  A Foley catheter was placed in the bladder and a sponge stick was placed in the vaginal vault to use for uterine manipulation as needed.  Gloves were changed.  A Pfannenstiel incision was made 2  fingerbreadths above the symphysis pubis.  Sharp dissection was used to identify the fascia.  Fascia was opened in the midline and opened in a transverse fashion.  Superior aspect of the fascia was grasped with Kocher clamps and the recti muscles were  dissected free.  Inferior aspect of the fascia was grasped with Kocher clamps and the pyramidalis muscle was dissected free.  Entry into the peritoneal cavity was accomplished sharply.  The right ovary was  identified with an 11 cm cyst within.  The ovary  was brought out and exteriorized from the patient's abdomen and draped off with laparotomy sponges.  The ovarian capsule was opened with a 15 blade scalpel and the cortex of the ovary was dissected free from the ovarian cyst.  The ovarian cyst was  brought out in total without rupture.  Bovie cautery was used to control hemostasis.  The ovarian capsule was then closed with a running pursestring suture with good approximation of the capsule.  Arista was used at the base of the ovarian capsule given  the small amount of bleeding and the right ovary was then draped with Interceed and placed back in the abdominal cavity.  The left ovary appeared normal with no evident or obvious cyst.  The fascia was then closed with 0 Vicryl suture in a running  nonlocking fashion.  Good approximation of edges.  Fascial edges were then injected with a solution of 20 mL of 1.3% Exparel plus 30 mL of 0.5% Marcaine plus 50 mL normal saline.  The 60 mL of this solution was used.  Subcutaneous tissues were irrigated  and bovied for hemostasis.  Given the depth of the subcutaneous tissue was approximately 3 cm, the dead space was closed with 2-0 chromic suture.  The skin was then reapproximated with Insorb absorbable staples.  The skin was then injected with  additional 30 mL of solution.  Sterile dressing was applied.  COMPLICATIONS:  There were no complications.  ESTIMATED BLOOD LOSS:  10 mL.  URINE OUTPUT:  200 mL.  INTRAOPERATIVE FLUIDS:  900 mL.  DISPOSITION:  The patient was taken to recovery room in good  condition.  TN/NUANCE  D:11/11/2019 T:11/11/2019 JOB:[id number]

## 2019-11-11 NOTE — Brief Op Note (Signed)
11/11/2019  9:31 AM  PATIENT:  Mary Brewer  28 y.o. female  PRE-OPERATIVE DIAGNOSIS:  15 +[redacted]weeks EGA Right dermoid ovarian cyst  POST-OPERATIVE DIAGNOSIS: same as above  PROCEDURE:  Procedure(s): EXPLORATORY LAPAROTOMY (Right) OVARIAN CYSTECTOMY (Right)  SURGEON:  Surgeon(s) and Role:    * Evona Westra, Gwen Her, MD - Primary    * Ward, Honor Loh, MD  PHYSICIAN ASSISTANT: [employer] student [place]  ASSISTANTS: none   ANESTHESIA:   general  EBL:  10 mL , IOF 900 cc, uo 200 cc  BLOOD ADMINISTERED:none  DRAINS: none   LOCAL MEDICATIONS USED:  MARCAINE    and BUPIVICAINE   SPECIMEN:  Source of Specimen:  right ovarian cyst   DISPOSITION OF SPECIMEN:  PATHOLOGY  COUNTS:  YES  TOURNIQUET:  * No tourniquets in log *  DICTATION: .Other Dictation: Dictation Number verbal  PLAN OF CARE: Discharge to home after PACU  PATIENT DISPOSITION:  PACU - hemodynamically stable.   Delay start of Pharmacological VTE agent (>24hrs) due to surgical blood loss or risk of bleeding: not applicable

## 2019-11-11 NOTE — Anesthesia Preprocedure Evaluation (Signed)
Anesthesia Evaluation  Patient identified by MRN, date of birth, ID band Patient awake    Reviewed: Allergy & Precautions, NPO status , Patient's Chart, lab work & pertinent test results  History of Anesthesia Complications Negative for: history of anesthetic complications  Airway Mallampati: III  TM Distance: >3 FB Neck ROM: Full    Dental no notable dental hx.    Pulmonary neg pulmonary ROS, neg sleep apnea, neg COPD,    breath sounds clear to auscultation- rhonchi (-) wheezing      Cardiovascular (-) hypertension(-) CAD, (-) Past MI, (-) Cardiac Stents and (-) CABG  Rhythm:Regular Rate:Normal - Systolic murmurs and - Diastolic murmurs    Neuro/Psych  Headaches, neg Seizures Hx of hydrocephalus and VP shunt as a child, asymptomatic at this time negative psych ROS   GI/Hepatic negative GI ROS, Neg liver ROS,   Endo/Other  negative endocrine ROSneg diabetes  Renal/GU negative Renal ROS     Musculoskeletal negative musculoskeletal ROS (+)   Abdominal (+) + obese,   Peds  Hematology negative hematology ROS (+)   Anesthesia Other Findings Past Medical History: No date: Dyspnea No date: Headache     Comment:  migraines No date: Hydrocephalus (HCC)     Comment:  birth and VP shunt [place] [facility]   Reproductive/Obstetrics (+) Pregnancy                             Anesthesia Physical Anesthesia Plan  ASA: II  Anesthesia Plan: General   Post-op Pain Management:    Induction: Intravenous and Rapid sequence  PONV Risk Score and Plan: 2 and Ondansetron and Dexamethasone  Airway Management Planned: Oral ETT  Additional Equipment:   Intra-op Plan:   Post-operative Plan: Extubation in OR  Informed Consent: I have reviewed the patients History and Physical, chart, labs and discussed the procedure including the risks, benefits and alternatives for the proposed anesthesia with the patient or  authorized representative who has indicated his/her understanding and acceptance.     Dental advisory given  Plan Discussed with: CRNA and Anesthesiologist  Anesthesia Plan Comments: (FHR by doppler prior to procedure 147, will repeat FHR in PACU Discussed increased risk of miscarriage with surgery in the first trimester)        Anesthesia Quick Evaluation

## 2019-11-11 NOTE — Progress Notes (Signed)
FHR 147 performed by Marisa Sprinkles from L&D at [facility].

## 2019-11-11 NOTE — Progress Notes (Signed)
Pt pain still 7/10 pain after another 100 mg Fentanyl. Dr. Randa Lynn notified. Acknowledged. Orders received.

## 2019-11-11 NOTE — Progress Notes (Signed)
Pt ready for surgery . FHT 147 . All questions answered . Labs reviewed . Proceed .

## 2019-11-11 NOTE — Anesthesia Postprocedure Evaluation (Signed)
Anesthesia Post Note  Patient: Mary Brewer  Procedure(s) Performed: EXPLORATORY LAPAROTOMY (Right ) OVARIAN CYSTECTOMY (Right )  Patient location during evaluation: PACU Anesthesia Type: General Level of consciousness: awake and alert and oriented Pain management: pain level controlled Vital Signs Assessment: post-procedure vital signs reviewed and stable Respiratory status: spontaneous breathing, nonlabored ventilation and respiratory function stable Cardiovascular status: blood pressure returned to baseline and stable Postop Assessment: no signs of nausea or vomiting Anesthetic complications: no   FHR by ultrasound 130  Last Vitals:  Vitals:   11/11/19 0638 11/11/19 0948  BP: 117/80 137/77  Pulse: 78 73  Resp: 16 18  Temp: 36.6 C (!) 36.2 C  SpO2: 100% 100%    Last Pain:  Vitals:   11/11/19 0948  TempSrc:   PainSc: Asleep                 Neyla Gauntt

## 2019-11-11 NOTE — Progress Notes (Signed)
Dr. Ouida Sills at bedside to check for Fetal Heart Tones. Pt dressing undone as doppler and ultrasound not able to pass through honeycomb dressing. Dr. Ouida Sills able to view fetus on ultrasound and fetal heart tones noted at 130. Pt wound cleaned with betadine and allowed to dry. Honeycomb replaced and 4x4, ABD, and paper tape placed over the honeycomb to provide pressure. Ancef 2g started for prophylaxis.

## 2019-11-11 NOTE — Progress Notes (Signed)
Pt having 10/10 pain after 150mg  Fentanyl. Dr. Randa Lynn notified. Acknowledged. Orders received.

## 2019-11-11 NOTE — Anesthesia Post-op Follow-up Note (Signed)
Anesthesia QCDR form completed.        

## 2019-11-11 NOTE — Discharge Summary (Signed)
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 Normal FHM on u/s post at 130 . Pain adequately controlled with narcotic before d/c  Video vist follow up with me in 2 weeks  Wound care discussed with the pt  Laverta Baltimore MD

## 2019-11-11 NOTE — Discharge Instructions (Signed)
AMBULATORY SURGERY  °DISCHARGE INSTRUCTIONS ° ° °1) The drugs that you were given will stay in your system until tomorrow so for the next 24 hours you should not: ° °A) Drive an automobile °B) Make any legal decisions °C) Drink any alcoholic beverage ° ° °2) You may resume regular meals tomorrow.  Today it is better to start with liquids and gradually work up to solid foods. ° °You may eat anything you prefer, but it is better to start with liquids, then soup and crackers, and gradually work up to solid foods. ° ° °3) Please notify your doctor immediately if you have any unusual bleeding, trouble breathing, redness and pain at the surgery site, drainage, fever, or pain not relieved by medication. ° ° ° °4) Additional Instructions: ° ° ° ° ° ° ° °Please contact your physician with any problems or Same Day Surgery at [phone], Monday through Friday 6 am to 4 pm, or [place] [facility] number at [phone].AMBULATORY SURGERY  °DISCHARGE INSTRUCTIONS ° ° °5) The drugs that you were given will stay in your system until tomorrow so for the next 24 hours you should not: ° °D) Drive an automobile °E) Make any legal decisions °F) Drink any alcoholic beverage ° ° °6) You may resume regular meals tomorrow.  Today it is better to start with liquids and gradually work up to solid foods. ° °You may eat anything you prefer, but it is better to start with liquids, then soup and crackers, and gradually work up to solid foods. ° ° °7) Please notify your doctor immediately if you have any unusual bleeding, trouble breathing, redness and pain at the surgery site, drainage, fever, or pain not relieved by medication. ° ° ° °8) Additional Instructions: ° ° ° ° ° ° ° °Please contact your physician with any problems or Same Day Surgery at [phone], Monday through Friday 6 am to 4 pm, or [place] [facility] number at [phone]. °

## 2019-11-11 NOTE — Transfer of Care (Signed)
Immediate Anesthesia Transfer of Care Note  Patient: Mary Brewer  Procedure(s) Performed: EXPLORATORY LAPAROTOMY (Right ) OVARIAN CYSTECTOMY (Right )  Patient Location: PACU  Anesthesia Type:General  Level of Consciousness: sedated  Airway & Oxygen Therapy: Patient Spontanous Breathing and Patient connected to face mask oxygen  Post-op Assessment: Report given to RN and Post -op Vital signs reviewed and stable  Post vital signs: Reviewed and stable  Last Vitals:  Vitals Value Taken Time  BP 137/77 11/11/19 0944  Temp    Pulse 77 11/11/19 0944  Resp 23 11/11/19 0944  SpO2 100 % 11/11/19 0944  Vitals shown include unvalidated device data.  Last Pain:  Vitals:   11/11/19 0638  TempSrc: Temporal  PainSc: 0-No pain         Complications: No apparent anesthesia complications

## 2019-11-14 LAB — SURGICAL PATHOLOGY

## 2020-08-19 IMAGING — US US OB COMP LESS 14 WK
1 of 2 series · 13 of 28 positions shown · non-contrast
Comparison: None.

CLINICAL DATA: LOWER abdominal pain for 1 day. Quantitative beta
days. EDC by LMP is 07/04/2020.

EXAM:
OBSTETRIC <14 WK US AND TRANSVAGINAL OB US
TECHNIQUE: Both transabdominal and transvaginal ultrasound examinations were
performed for complete evaluation of the gestation as well as the
maternal uterus, adnexal regions, and pelvic cul-de-sac.
Transvaginal technique was performed to assess early pregnancy.

[Series 1: us ob comp less 14 wk · 121 acquisitions, 13 frames shown]
[im 5/121]
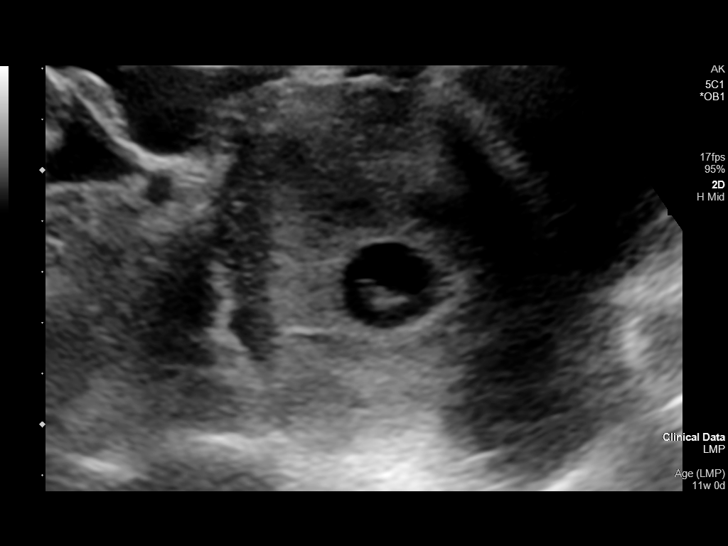
[im 14/121]
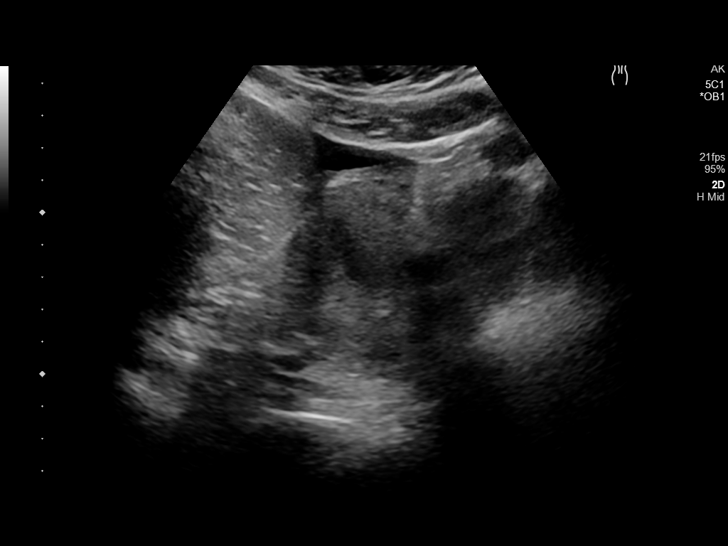
[im 24/121]
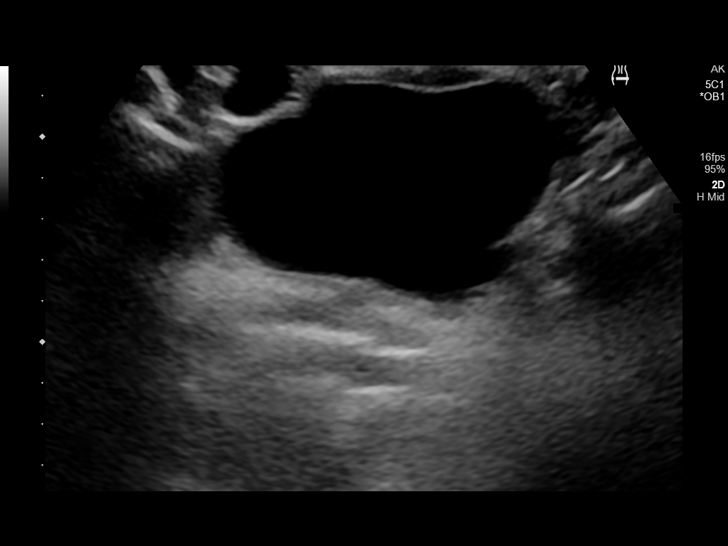
[im 33/121]
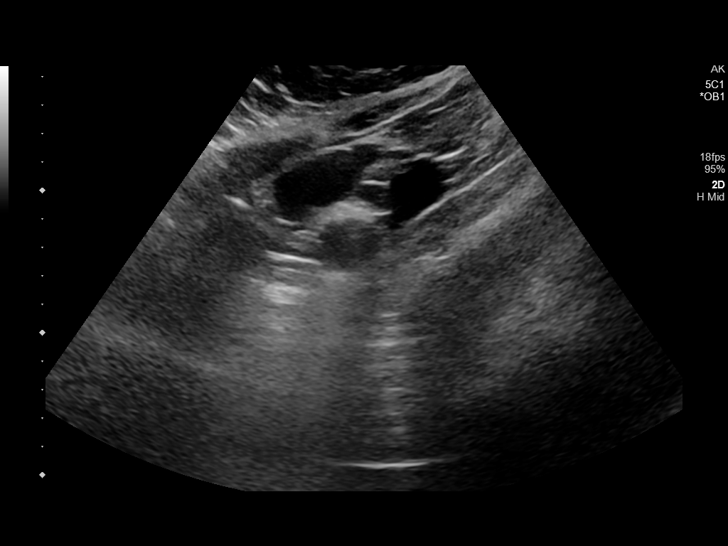
[im 42/121]
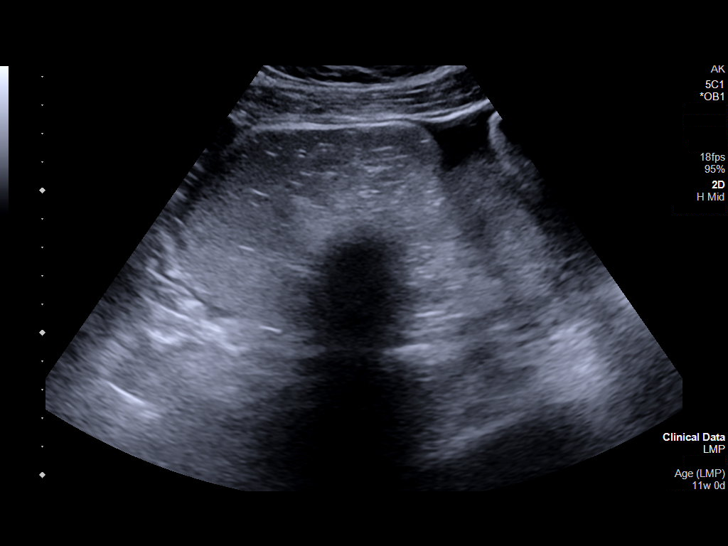
[im 51/121]
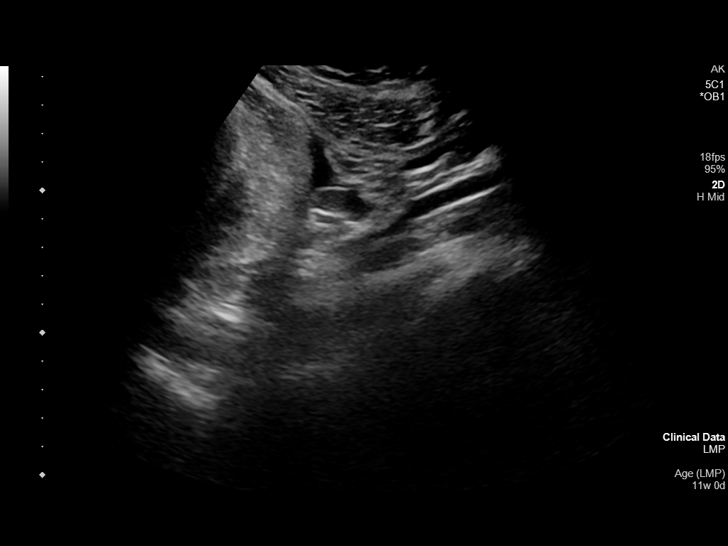
[im 65/121]
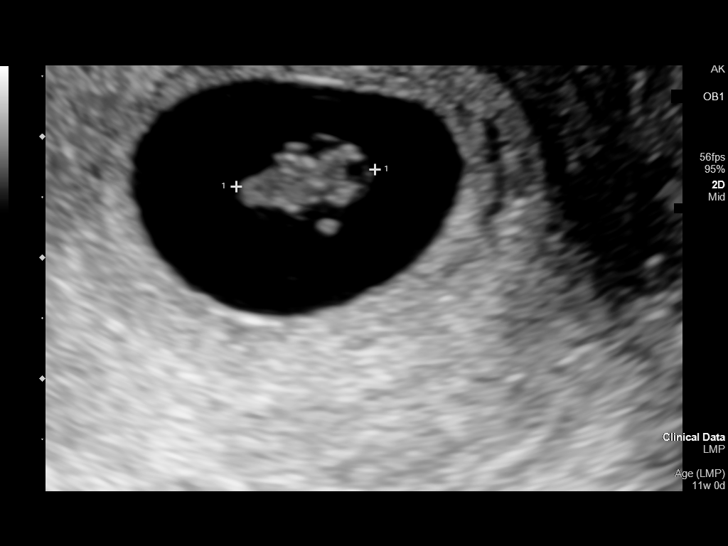
[im 74/121]
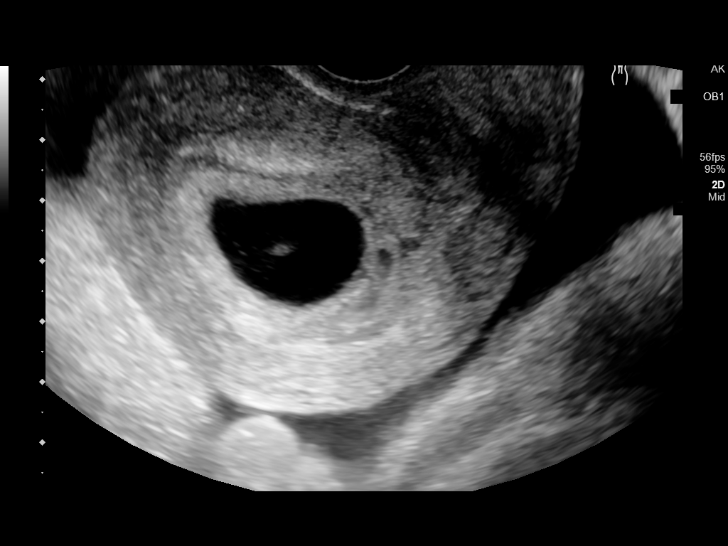
[im 84/121]
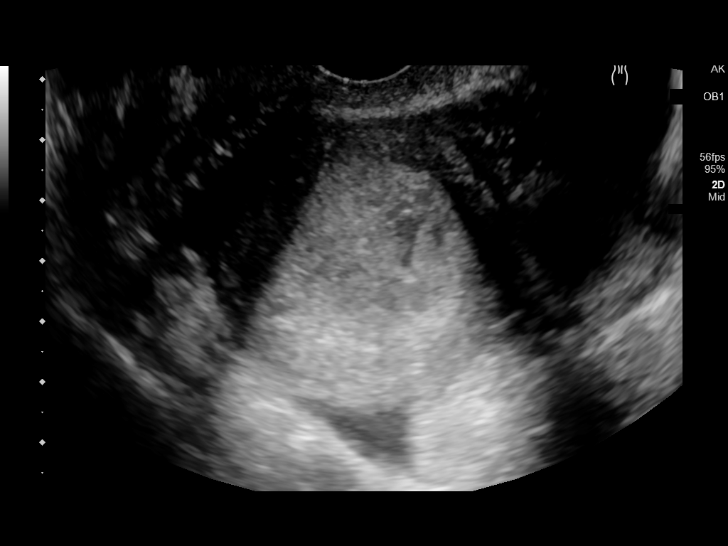
[im 93/121]
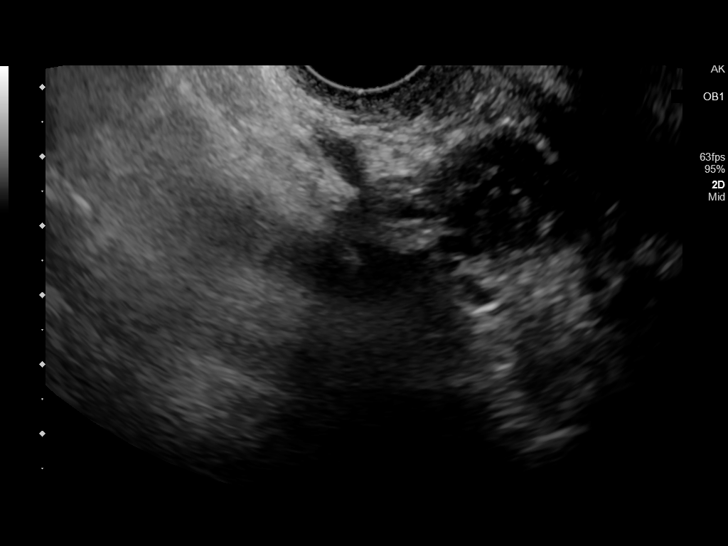
[im 102/121]
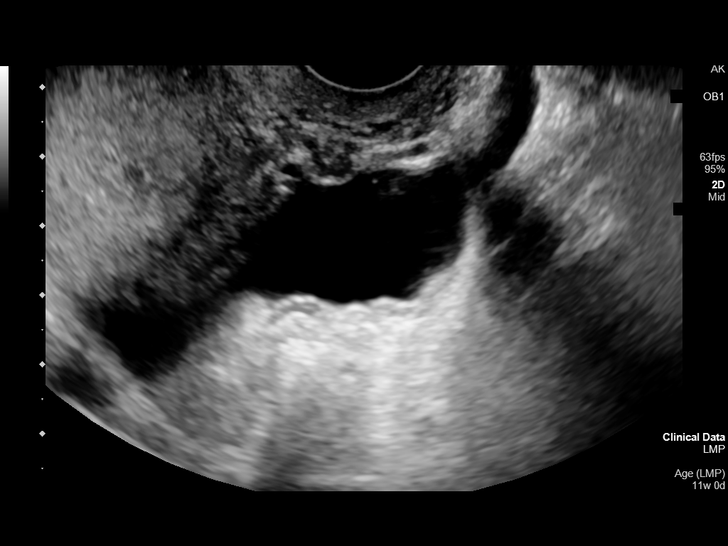
[im 111/121]
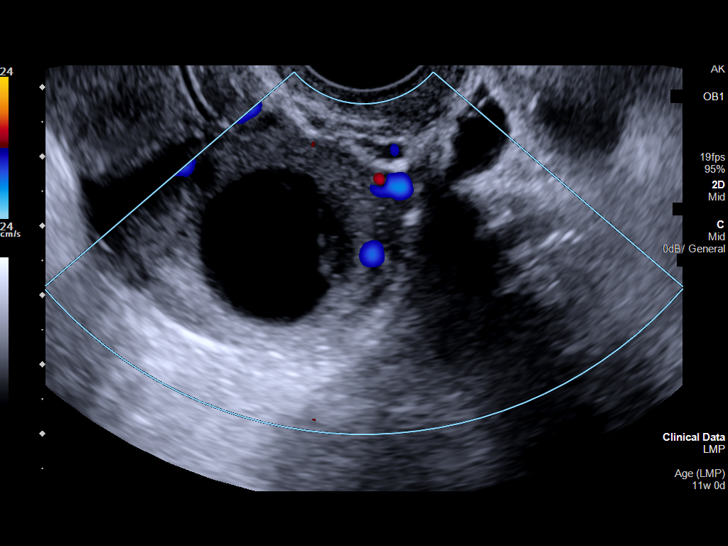
[im 121/121]
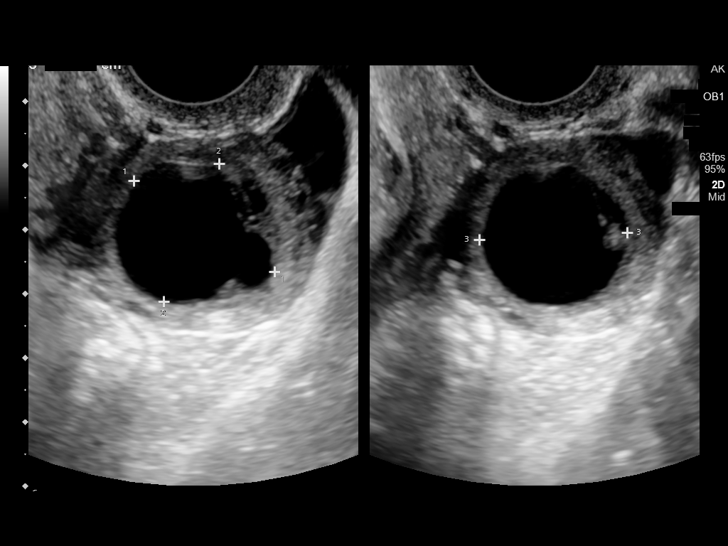

[13 of 28 positions shown; findings below may reference images not displayed]

FINDINGS: Intrauterine gestational sac: Single

Yolk sac:  Visualized.

Embryo:  Visualized.

Cardiac Activity: Visualized.

Heart Rate: 145 bpm

CRL:  12.4 mm   7 w   3 d                  US EDC: 04/29/2020

Subchorionic hemorrhage:  None visualized.

Maternal uterus/adnexae: LEFT corpus luteum cyst is 2.3 centimeters.
A heterogeneous hyperechoic RIGHT adnexal mass measures 11.2 x 8.6 x
11.8 centimeters and contains irregular shadowing component.
Findings are consistent with benign dermoid
IMPRESSION: 1. Single living intrauterine embryo measuring 7 weeks 3 days,
significantly different from clinical dating. By today's exam, EDC
is 04/29/2020.
2. Large complex hyperechoic mass within the RIGHT adnexal region,
consistent with ovarian dermoid measuring at least 11.8 centimeters.
